# Patient Record
Sex: Male | Born: 2005 | Hispanic: No | Marital: Single | Smoking: Never smoker
Health system: Southern US, Community
[De-identification: ages and names within clinical notes are randomized; demographics above are authoritative.]

## PROBLEM LIST (undated history)

## (undated) HISTORY — PX: ADENOIDECTOMY: SUR15

## (undated) HISTORY — PX: TYMPANOSTOMY TUBE PLACEMENT: SHX32

## (undated) HISTORY — PX: SINOSCOPY: SHX187

## (undated) HISTORY — PX: TONSILLECTOMY: SUR1361

---
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
## 2023-09-25 ENCOUNTER — Encounter: Payer: Self-pay | Admitting: Family Medicine

## 2023-09-25 ENCOUNTER — Ambulatory Visit: Admitting: Family Medicine

## 2023-09-25 VITALS — BP 108/66 | HR 71 | Resp 16 | Ht 68.0 in | Wt 149.0 lb

## 2023-09-25 DIAGNOSIS — R196 Halitosis: Secondary | ICD-10-CM

## 2023-09-25 DIAGNOSIS — R053 Chronic cough: Secondary | ICD-10-CM | POA: Diagnosis not present

## 2023-09-25 DIAGNOSIS — Z0000 Encounter for general adult medical examination without abnormal findings: Secondary | ICD-10-CM | POA: Diagnosis not present

## 2023-09-25 DIAGNOSIS — L709 Acne, unspecified: Secondary | ICD-10-CM | POA: Diagnosis not present

## 2023-09-25 DIAGNOSIS — Z1159 Encounter for screening for other viral diseases: Secondary | ICD-10-CM

## 2023-09-25 MED ORDER — PANTOPRAZOLE SODIUM 40 MG PO TBEC: 40.0000 mg | DELAYED_RELEASE_TABLET | Freq: Every day | ORAL | 3 refills | Status: AC

## 2023-09-25 NOTE — Progress Notes (Signed)
 Name: Tony Smith   MRN: [id number]    DOB: [birth year]   Date:09/25/2023       Progress Note  Chief Complaint  Patient presents with   Establish Care   Cough    Randomly, lasts about 30 secs. X2 years   Halitosis     Subjective:   Tony Smith is a 18 y.o. male, presents to clinic to est care concerns about cough/halitosis  Pediatric care last 1-2 years at [facility] got updated vaccines Dr. Silva  Hx of asthma in chart but pt denies, allergies coughing dry and rough fits of coughing sometimes when he is drinking or talking  Halitosis - a year No GERD sx, no changes to diet  Acne abx creams - on going for years, tried only OTC stuff, no past derm consult on face and neck/back intermittent  Discussed the use of AI scribe software for clinical note transcription with the patient, who gave verbal consent to proceed.  History of Present Illness Tony Smith is a 19 year old male who presents with chronic coughing and bad breath.  Chronic cough - Dry, rough coughing episodes lasting 10 to 15 minutes - Episodes occur unpredictably, sometimes after drinking tea or talking extensively - No choking on liquids - No dysphagia - No symptoms of acid reflux - History of sinus infections - hx in chart of allergies and asthma but pt disputes this?  Halitosis and oral findings - Bad breath present for at least one year - Dry mouth upon waking - White coating on tongue - Brushes teeth regularly - Does not floss frequently - Recent dental visit     No current outpatient medications on file.  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   Rectal ulcer 08/06/2016    Past Surgical History:  Procedure Laterality Date   ADENOIDECTOMY     SINOSCOPY     TONSILLECTOMY     TYMPANOSTOMY TUBE PLACEMENT      Family History   Problem Relation Age of Onset   Allergic rhinitis Neg Hx    Angioedema Neg Hx    Asthma Neg Hx    Eczema Neg Hx    Immunodeficiency Neg Hx    Urticaria Neg Hx     Social History   Tobacco Use   Smoking status: Never   Smokeless tobacco: Never  Vaping Use   Vaping status: Never Used  Substance Use Topics   Alcohol use: Not Currently   Drug use: Never     No Known Allergies  Health Maintenance  Topic Date Due   HIV Screening  Never done   Hepatitis C Screening  Never done   COVID-19 Vaccine (6 - 2024-25 season) 10/10/2023 (Originally 12/26/2022)   INFLUENZA VACCINE  04/28/2024 (Originally 08/30/2023)   DTaP/Tdap/Td (7 - Td or Tdap) 10/23/2025   Hepatitis B Vaccines 19-59 Average Risk  Completed   HPV VACCINES  Completed   Meningococcal B Vaccine  Completed   Pneumococcal Vaccine  Aged Out    Chart Review Today: I personally reviewed active problem list, medication list, allergies, family history, social history, health maintenance, notes from last encounter, lab results, imaging with the patient/caregiver today.   Review of Systems  Constitutional: Negative.   HENT: Negative.    Eyes: Negative.   Respiratory: Negative.    Cardiovascular: Negative.   Gastrointestinal: Negative.   Endocrine: Negative.  Genitourinary: Negative.   Musculoskeletal: Negative.   Skin: Negative.   Allergic/Immunologic: Negative.   Neurological: Negative.   Hematological: Negative.   Psychiatric/Behavioral: Negative.    All other systems reviewed and are negative.    Objective:   Vitals:   09/25/23 1458  BP: 108/66  Pulse: 71  Resp: 16  SpO2: 99%  Weight: 149 lb (67.6 kg)  Height: 5' 8 (1.727 m)    Body mass index is 22.66 kg/m.  Physical Exam Vitals and nursing note reviewed.  Constitutional:      General: He is not in acute distress.    Appearance: Normal appearance. He is well-developed. He is not ill-appearing, toxic-appearing or diaphoretic.  HENT:     Head:  Normocephalic and atraumatic.     Right Ear: Tympanic membrane, ear canal and external ear normal. There is no impacted cerumen.     Left Ear: Ear canal and external ear normal. There is no impacted cerumen.     Nose: Congestion and rhinorrhea present.     Mouth/Throat:     Mouth: Mucous membranes are moist.     Pharynx: Oropharynx is clear. No oropharyngeal exudate or posterior oropharyngeal erythema.  Eyes:     General: No scleral icterus.       Right eye: No discharge.        Left eye: No discharge.     Conjunctiva/sclera: Conjunctivae normal.  Neck:     Trachea: No tracheal deviation.  Cardiovascular:     Rate and Rhythm: Normal rate.     Pulses: Normal pulses.     Heart sounds: Normal heart sounds.  Pulmonary:     Effort: Pulmonary effort is normal. No respiratory distress.     Breath sounds: Normal breath sounds. No stridor. No wheezing, rhonchi or rales.  Skin:    General: Skin is warm and dry.     Findings: No rash.  Neurological:     Mental Status: He is alert.     Motor: No abnormal muscle tone.     Coordination: Coordination normal.     Gait: Gait normal.  Psychiatric:        Mood and Affect: Mood normal.        Behavior: Behavior normal.      Functional Status Survey: Is the patient deaf or have difficulty hearing?: No Does the patient have difficulty seeing, even when wearing glasses/contacts?: No Does the patient have difficulty concentrating, remembering, or making decisions?: No Does the patient have difficulty walking or climbing stairs?: No Does the patient have difficulty dressing or bathing?: No Does the patient have difficulty doing errands alone such as visiting a doctor's office or shopping?: No Results for orders placed or performed in visit on 08/06/16  F328-IgE Fig   Collection Time: 08/06/16  3:43 PM  Result Value Ref Range   F328-IgE Fig 1.41 (A) Class III kU/L      Assessment & Plan:   Assessment & Plan  Chronic dry cough episodes  and fits and halitosis ongoing for years + Differential cough includes postnasal drip, airway issues, allergies, reactive airway, GERD.  Halitosis may be linked to dry mouth and bacterial growth, mouth breathing at night, recommended he start flossing regularly, trial of PPI  He has recently had a reported normal dental check-up. - Prescribe pantoprazole  for potential silent reflux trial for 4 wks - Recommend Biotene mouthwash for dry mouth. - Advise regular flossing for oral hygiene. - Consider swallow study if symptoms persist/sleep eval/ENT consult? -  Advise follow-up if no improvement or for further evaluation.  Acne involving face and back Long-standing acne on face and back, improved recently but previously severe. Limited success with over-the-counter treatments. Dermatology referral considered for comprehensive management. - Refer to dermatology for evaluation and management.    ICD-10-CM   1. Encounter for medical examination to establish care  Z00.00    need to request recent PCP/pediatrician records    2. Chronic cough  R05.3    see above - reviewed how it can be from ENT, throat, GI causes allergies etc, many ways to go about evaluating sx, even trial of albuterol rescue inhaler    3. Halitosis  R19.6    see above    4. Acne, unspecified acne type  L70.9    derm consult recommended if longstanding and occurs to face, neck and back       Recording duration: 28 minutes      Return for 4w + As needed if not improving, CPE in ~2 m.   Michelene Cower, PA-C 09/25/23 3:17 PM

## 2023-09-25 NOTE — Patient Instructions (Addendum)
 Biotene mouth wash for dry mouth Sometimes you have to treat nasal passages and allergie to help you breath through your nose at night and not mouth.    For coughing and breath symptoms -  Try the pantoprazole  in the morning for at least 2 to 4 weeks. We could also do swallow eval, consult ENT for their opinion, try adding allergy meds.

## 2024-01-14 ENCOUNTER — Ambulatory Visit: Admitting: Nurse Practitioner

## 2024-01-15 ENCOUNTER — Encounter: Payer: Self-pay | Admitting: Nurse Practitioner

## 2024-01-15 ENCOUNTER — Ambulatory Visit: Admitting: Nurse Practitioner

## 2024-01-15 VITALS — BP 118/70 | HR 70 | Temp 98.0°F | Ht 68.0 in | Wt 151.0 lb

## 2024-01-15 DIAGNOSIS — J069 Acute upper respiratory infection, unspecified: Secondary | ICD-10-CM | POA: Diagnosis not present

## 2024-01-15 NOTE — Patient Instructions (Signed)
 Recommend taking zyrtec, flonase, mucinex, vitamin d, vitamin c, and zinc. Push fluids and get rest.

## 2024-01-15 NOTE — Progress Notes (Signed)
 BP 118/70   Pulse 70   Temp 98 F (36.7 C)   Ht 5' 8 (1.727 m)   Wt 151 lb (68.5 kg)   SpO2 97%   BMI 22.96 kg/m    Subjective:    Patient ID: Tony Smith, male    DOB: 06-26-2005, 18 y.o.   MRN: [id number]  HPI: Tony Smith is a 18 y.o. male  Chief Complaint  Patient presents with   Cough    Pt c/o symptoms have been going on for 1 week.    Sore Throat   Discussed the use of AI scribe software for clinical note transcription with the patient, who gave verbal consent to proceed.  History of Present Illness Tony Smith is a 18 year old male who presents with upper respiratory symptoms for one week.  Upper respiratory symptoms - Cough present for one week, improved since yesterday but recurs with excessive talking - Rhinorrhea described as postnasal drip rather than anterior nasal drainage - Voice is higher-pitched than usual  Associated symptoms - No fever - No shortness of breath - No ear pain - No significant sore throat - Headache present, treated with aspirin a couple of nights ago - No other medications taken for symptoms  Symptom recurrence - Similar episode occurred approximately August 27 with same symptoms - Previously sought medical attention to rule out serious illness         09/25/2023    2:51 PM  Depression screen PHQ 2/9  Decreased Interest 0  Down, Depressed, Hopeless 0  PHQ - 2 Score 0    Relevant past medical, surgical, family and social history reviewed and updated as indicated. Interim medical history since our last visit reviewed. Allergies and medications reviewed and updated.  Review of Systems  Ten systems reviewed and is negative except as mentioned in HPI      Objective:      BP 118/70   Pulse 70   Temp 98 F (36.7 C)   Ht 5' 8 (1.727 m)   Wt 151 lb (68.5 kg)   SpO2 97%   BMI 22.96 kg/m    Wt Readings from Last 3 Encounters:  01/15/24 151 lb (68.5 kg)  09/25/23 149 lb (67.6 kg)   11/28/19 122 lb 12.7 oz (55.7 kg) (25%, Z= -0.69)*   * Growth percentiles are based on CDC (Boys, 2-20 Years) data.    Physical Exam GENERAL: Alert, cooperative, well developed, no acute distress HEENT: Normocephalic, normal oropharynx, moist mucous membranes CHEST: Clear to auscultation bilaterally, no wheezes, rhonchi, or crackles CARDIOVASCULAR: Normal heart rate and rhythm, S1 and S2 normal without murmurs ABDOMEN: Soft, non-tender, non-distended, without organomegaly, normal bowel sounds EXTREMITIES: No cyanosis or edema NEUROLOGICAL: Cranial nerves grossly intact, moves all extremities without gross motor or sensory deficit  Results for orders placed or performed in visit on 08/06/16  F328-IgE Fig   Collection Time: 08/06/16  3:43 PM  Result Value Ref Range   F328-IgE Fig 1.41 (A) Class III kU/L          Assessment & Plan:   Problem List Items Addressed This Visit   None Visit Diagnoses       Viral upper respiratory tract infection    -  Primary        Assessment and Plan Assessment & Plan Acute upper respiratory infection Symptoms consistent with an acute upper respiratory infection, including cough, post-nasal drip, and sore throat, persisting for one week. No fever, shortness of breath, or  ear pain. Lungs are clear on examination. Likely viral etiology, similar to a previous episode in August. He prefers to manage symptoms with over-the-counter medications. - Recommend taking zyrtec , flonase , mucinex, vitamin d, vitamin c, and zinc. Push fluids and get rest.    - Instructed to monitor symptoms and report any changes.        Follow up plan: Return if symptoms worsen or fail to improve.
